# Patient Record
Sex: Female | Born: 1935 | Marital: Married
Health system: Southern US, Community
[De-identification: ages and names within clinical notes are randomized; demographics above are authoritative.]

---

## 2011-05-08 ENCOUNTER — Other Ambulatory Visit: Payer: Self-pay | Admitting: Family Medicine

## 2011-05-08 DIAGNOSIS — M25552 Pain in left hip: Secondary | ICD-10-CM

## 2011-05-12 ENCOUNTER — Ambulatory Visit
Admission: RE | Admit: 2011-05-12 | Discharge: 2011-05-12 | Disposition: A | Discharge status: Home / Self Care | Payer: Medicare Other | Source: Ambulatory Visit | Attending: Family Medicine | Admitting: Family Medicine

## 2011-05-12 DIAGNOSIS — M25552 Pain in left hip: Secondary | ICD-10-CM

## 2011-08-27 ENCOUNTER — Other Ambulatory Visit: Payer: Self-pay | Admitting: Family Medicine

## 2011-09-01 ENCOUNTER — Other Ambulatory Visit: Payer: Medicare Other

## 2016-01-07 DEATH — deceased

## 2018-02-13 ENCOUNTER — Ambulatory Visit (INDEPENDENT_AMBULATORY_CARE_PROVIDER_SITE_OTHER): Payer: Medicare Other | Admitting: Orthopaedic Surgery

## 2018-02-13 VITALS — BP 164/74 | HR 65 | Ht 60.0 in | Wt 156.0 lb

## 2018-02-13 DIAGNOSIS — M25561 Pain in right knee: Secondary | ICD-10-CM

## 2018-03-07 ENCOUNTER — Encounter (INDEPENDENT_AMBULATORY_CARE_PROVIDER_SITE_OTHER): Payer: Self-pay | Admitting: Orthopaedic Surgery

## 2018-03-07 MED ORDER — BUPIVACAINE HCL 0.25 % IJ SOLN: 4.0000 mL | INTRAMUSCULAR | Status: DC | PRN

## 2018-03-07 MED ORDER — LIDOCAINE HCL 1 % IJ SOLN: 0.5000 mL | INTRAMUSCULAR | Status: DC | PRN

## 2018-03-07 MED ORDER — METHYLPREDNISOLONE ACETATE 40 MG/ML IJ SUSP: 40.0000 mg | INTRAMUSCULAR | Status: DC | PRN

## 2018-03-07 NOTE — Progress Notes (Deleted)
Office Visit Note   Patient: Alexandra Buck           Date of Birth: Apr 23, 1927           MRN: [id number] Visit Date: 02/13/2018              Requested by: No referring provider defined for this encounter. PCP: No primary care provider on file.   Assessment & Plan: Visit Diagnoses:  1. Right knee pain, unspecified chronicity     Plan: Knee injection performed with some relief.  She will return if she has persistent problems.  Follow-Up Instructions: Return if symptoms worsen or fail to improve.   Orders:  Orders Placed This Encounter  Procedures  . Large Joint Inj: R knee   No orders of the defined types were placed in this encounter.     Procedures: Large Joint Inj: R knee on 03/07/2018 5:16 PM Indications: pain and joint swelling Details: 82 G 1.5 in needle, anterolateral approach  Arthrogram: No  Medications: 40 mg methylPREDNISolone acetate 40 MG/ML; 0.5 mL lidocaine 1 %; 4 mL bupivacaine 0.25 % Outcome: tolerated well, no immediate complications Procedure, treatment alternatives, risks and benefits explained, specific risks discussed. Consent was given by the patient. Immediately prior to procedure a time out was called to verify the correct patient, procedure, equipment, support staff and site/side marked as required. Patient was prepped and draped in the usual sterile fashion.       Clinical Data: No additional findings.   Subjective: Chief Complaint  Patient presents with  . Right Knee - Pain    HPI 84 year old female requests a right knee injection.  She is at increased pain difficulty walking due to knee osteoarthritis.  History of type 2 diabetes stage III chronic kidney disease but does not use insulin. Past history of hip avascular necrosis without hip surgery.  Has had pain with ambulation aching swelling in her knee.  Review of Systems positive for history of dehydration, type 2 diabetes not on insulin, pain swelling synovitis, hypertension, oral  abscess otherwise negative as pertains to   Objective: Vital Signs: BP (!) 164/74   Pulse 65   Ht 5' (1.524 m)   Wt 156 lb (70.8 kg)   BMI 30.47 kg/m   Physical Exam  Constitutional: She is oriented to person, place, and time. She appears well-developed.  HENT:  Head: Normocephalic.  Right Ear: External ear normal.  Left Ear: External ear normal.  Eyes: Pupils are equal, round, and reactive to light.  Neck: No tracheal deviation present. No thyromegaly present.  Cardiovascular: Normal rate.  Pulmonary/Chest: Effort normal.  Abdominal: Soft.  Neurological: She is alert and oriented to person, place, and time.  Skin: Skin is warm and dry.  Psychiatric: She has a normal mood and affect. Her behavior is normal.    Ortho Exam crepitus with right knee range of motion.  Patient's been amatory used a cane.  Distal pulses palpable.  Specialty Comments:  No specialty comments available.  Imaging: No results found.   PMFS History: There are no active problems to display for this patient.  History reviewed. No pertinent past medical history.  History reviewed. No pertinent family history.  History reviewed. No pertinent surgical history. Social History   Occupational History  . Not on file  Tobacco Use  . Smoking status: Not on file  Substance and Sexual Activity  . Alcohol use: Not on file  . Drug use: Not on file  . Sexual activity: Not  on file

## 2018-08-02 ENCOUNTER — Other Ambulatory Visit (HOSPITAL_COMMUNITY): Payer: Self-pay

## 2018-08-02 ENCOUNTER — Inpatient Hospital Stay
Admission: AD | Admit: 2018-08-02 | Discharge: 2018-08-21 | Disposition: A | Discharge status: Skilled Nursing Facility | Payer: Self-pay | Source: Other Acute Inpatient Hospital | Attending: Internal Medicine | Admitting: Internal Medicine

## 2018-08-02 DIAGNOSIS — Z452 Encounter for adjustment and management of vascular access device: Secondary | ICD-10-CM

## 2018-08-02 DIAGNOSIS — N19 Unspecified kidney failure: Secondary | ICD-10-CM

## 2018-08-02 DIAGNOSIS — D72829 Elevated white blood cell count, unspecified: Secondary | ICD-10-CM

## 2018-08-02 LAB — VANCOMYCIN, TROUGH: Vancomycin Tr: 42 ug/mL (ref 15–20)

## 2018-08-03 ENCOUNTER — Other Ambulatory Visit (HOSPITAL_COMMUNITY): Payer: Self-pay

## 2018-08-03 LAB — COMPREHENSIVE METABOLIC PANEL WITH GFR
ALT: 22 U/L (ref 0–44)
AST: 35 U/L (ref 15–41)
Albumin: 1.4 g/dL — ABNORMAL LOW (ref 3.5–5.0)
Alkaline Phosphatase: 277 U/L — ABNORMAL HIGH (ref 38–126)
Anion gap: 6 (ref 5–15)
BUN: 32 mg/dL — ABNORMAL HIGH (ref 8–23)
CO2: 12 mmol/L — ABNORMAL LOW (ref 22–32)
Calcium: 8.2 mg/dL — ABNORMAL LOW (ref 8.9–10.3)
Chloride: 121 mmol/L — ABNORMAL HIGH (ref 98–111)
Creatinine, Ser: 1.45 mg/dL — ABNORMAL HIGH (ref 0.44–1.00)
GFR calc Af Amer: 36 mL/min — ABNORMAL LOW
GFR calc non Af Amer: 31 mL/min — ABNORMAL LOW
Glucose, Bld: 75 mg/dL (ref 70–99)
Potassium: 4.8 mmol/L (ref 3.5–5.1)
Sodium: 139 mmol/L (ref 135–145)
Total Bilirubin: 0.6 mg/dL (ref 0.3–1.2)
Total Protein: 5.1 g/dL — ABNORMAL LOW (ref 6.5–8.1)

## 2018-08-03 LAB — CBC WITH DIFFERENTIAL/PLATELET
Basophils Absolute: 0.2 10*3/uL — ABNORMAL HIGH (ref 0.0–0.1)
Basophils Relative: 1 %
EOS ABS: 0 10*3/uL (ref 0.0–0.5)
Eosinophils Relative: 0 %
HCT: 27.8 % — ABNORMAL LOW (ref 36.0–46.0)
HEMOGLOBIN: 8.5 g/dL — AB (ref 12.0–15.0)
LYMPHS ABS: 0.8 10*3/uL (ref 0.7–4.0)
Lymphocytes Relative: 4 %
MCH: 28.2 pg (ref 26.0–34.0)
MCHC: 30.6 g/dL (ref 30.0–36.0)
MCV: 92.4 fL (ref 80.0–100.0)
MONOS PCT: 2 %
MYELOCYTES: 2 %
Metamyelocytes Relative: 3 %
Monocytes Absolute: 0.4 10*3/uL (ref 0.1–1.0)
NEUTROS PCT: 88 %
Neutro Abs: 19.7 10*3/uL — ABNORMAL HIGH (ref 1.7–7.7)
Platelets: 380 10*3/uL (ref 150–400)
RBC: 3.01 MIL/uL — ABNORMAL LOW (ref 3.87–5.11)
RDW: 17.9 % — ABNORMAL HIGH (ref 11.5–15.5)
WBC: 21.2 10*3/uL — ABNORMAL HIGH (ref 4.0–10.5)
nRBC: 0 % (ref 0.0–0.2)
nRBC: 0 /100 WBC

## 2018-08-03 LAB — TSH: TSH: 1.94 u[IU]/mL (ref 0.350–4.500)

## 2018-08-03 LAB — PROTIME-INR
INR: 1.27
Prothrombin Time: 15.8 s — ABNORMAL HIGH (ref 11.4–15.2)

## 2018-08-03 LAB — URINALYSIS, ROUTINE W REFLEX MICROSCOPIC
BILIRUBIN URINE: NEGATIVE
Glucose, UA: NEGATIVE mg/dL
KETONES UR: 5 mg/dL — AB
Nitrite: NEGATIVE
Protein, ur: NEGATIVE mg/dL
Specific Gravity, Urine: 1.019 (ref 1.005–1.030)
pH: 5 (ref 5.0–8.0)

## 2018-08-03 LAB — T4, FREE: FREE T4: 0.91 ng/dL (ref 0.82–1.77)

## 2018-08-03 LAB — VANCOMYCIN, TROUGH: VANCOMYCIN TR: 40 ug/mL — AB (ref 15–20)

## 2018-08-03 LAB — PHOSPHORUS: Phosphorus: 3.6 mg/dL (ref 2.5–4.6)

## 2018-08-03 LAB — HEMOGLOBIN A1C
HEMOGLOBIN A1C: 5.6 % (ref 4.8–5.6)
Mean Plasma Glucose: 114.02 mg/dL

## 2018-08-03 LAB — MAGNESIUM: MAGNESIUM: 2 mg/dL (ref 1.7–2.4)

## 2018-08-04 LAB — BASIC METABOLIC PANEL
ANION GAP: 4 — AB (ref 5–15)
BUN: 33 mg/dL — ABNORMAL HIGH (ref 8–23)
CALCIUM: 8.4 mg/dL — AB (ref 8.9–10.3)
CO2: 16 mmol/L — ABNORMAL LOW (ref 22–32)
Chloride: 119 mmol/L — ABNORMAL HIGH (ref 98–111)
Creatinine, Ser: 1.5 mg/dL — ABNORMAL HIGH (ref 0.44–1.00)
GFR, EST AFRICAN AMERICAN: 34 mL/min — AB (ref >60)
GFR, EST NON AFRICAN AMERICAN: 30 mL/min — AB (ref >60)
GLUCOSE: 145 mg/dL — AB (ref 70–99)
Potassium: 4.7 mmol/L (ref 3.5–5.1)
SODIUM: 139 mmol/L (ref 135–145)

## 2018-08-04 LAB — URINE CULTURE: CULTURE: NO GROWTH

## 2018-08-04 LAB — VANCOMYCIN, TROUGH: VANCOMYCIN TR: 29 ug/mL — AB (ref 15–20)

## 2018-08-04 LAB — LACTIC ACID, PLASMA: LACTIC ACID, VENOUS: 2 mmol/L — AB (ref 0.5–1.9)

## 2018-08-04 MED ORDER — INSULIN LISPRO 100 UNIT/ML ~~LOC~~ SOLN
2.00 | SUBCUTANEOUS | Status: DC
Start: 2018-08-02 — End: 2018-08-04

## 2018-08-04 MED ORDER — HYDROCODONE-ACETAMINOPHEN 5-325 MG PO TABS
1.00 | ORAL_TABLET | ORAL | Status: DC
Start: ? — End: 2018-08-04

## 2018-08-04 MED ORDER — HEPARIN SODIUM (PORCINE) 5000 UNIT/ML IJ SOLN
5000.00 | INTRAMUSCULAR | Status: DC
Start: 2018-08-02 — End: 2018-08-04

## 2018-08-04 MED ORDER — KCL IN DEXTROSE-NACL 20-5-0.45 MEQ/L-%-% IV SOLN
INTRAVENOUS | Status: DC
Start: ? — End: 2018-08-04

## 2018-08-04 MED ORDER — ACETAMINOPHEN 500 MG PO TABS
500.00 | ORAL_TABLET | ORAL | Status: DC
Start: 2018-08-02 — End: 2018-08-04

## 2018-08-04 MED ORDER — DULOXETINE HCL 20 MG PO CPEP
20.00 | ORAL_CAPSULE | ORAL | Status: DC
Start: 2018-08-02 — End: 2018-08-04

## 2018-08-04 MED ORDER — FERROUS GLUCONATE 324 (38 FE) MG PO TABS
324.00 | ORAL_TABLET | ORAL | Status: DC
Start: 2018-08-03 — End: 2018-08-04

## 2018-08-04 MED ORDER — LEVOTHYROXINE SODIUM 100 MCG PO TABS
100.00 | ORAL_TABLET | ORAL | Status: DC
Start: 2018-08-03 — End: 2018-08-04

## 2018-08-04 MED ORDER — PANTOPRAZOLE SODIUM 40 MG PO TBEC
40.00 | DELAYED_RELEASE_TABLET | ORAL | Status: DC
Start: 2018-08-03 — End: 2018-08-04

## 2018-08-04 MED ORDER — VANCOMYCIN HCL IN NACL 750-0.9 MG/150ML-% IV SOLN
750.00 | INTRAVENOUS | Status: DC
Start: ? — End: 2018-08-04

## 2018-08-04 MED ORDER — DEXTROSE 10 % IV SOLN
125.00 | INTRAVENOUS | Status: DC
Start: ? — End: 2018-08-04

## 2018-08-04 MED ORDER — GENERIC EXTERNAL MEDICATION
Status: DC
Start: 2018-08-02 — End: 2018-08-04

## 2018-08-04 MED ORDER — NYSTATIN 100000 UNIT/GM EX POWD
CUTANEOUS | Status: DC
Start: 2018-08-02 — End: 2018-08-04

## 2018-08-04 MED ORDER — GENERIC EXTERNAL MEDICATION
1.00 | Status: DC
Start: 2018-08-02 — End: 2018-08-04

## 2018-08-04 MED ORDER — POLYETHYLENE GLYCOL 3350 17 G PO PACK
17.00 | PACK | ORAL | Status: DC
Start: 2018-08-03 — End: 2018-08-04

## 2018-08-04 MED ORDER — MUPIROCIN 2 % EX OINT
TOPICAL_OINTMENT | CUTANEOUS | Status: DC
Start: 2018-08-02 — End: 2018-08-04

## 2018-08-05 ENCOUNTER — Other Ambulatory Visit (HOSPITAL_COMMUNITY): Payer: Self-pay

## 2018-08-05 LAB — CBC
HCT: 25.9 % — ABNORMAL LOW (ref 36.0–46.0)
HEMOGLOBIN: 8.1 g/dL — AB (ref 12.0–15.0)
MCH: 28.4 pg (ref 26.0–34.0)
MCHC: 31.3 g/dL (ref 30.0–36.0)
MCV: 90.9 fL (ref 80.0–100.0)
PLATELETS: 292 10*3/uL (ref 150–400)
RBC: 2.85 MIL/uL — ABNORMAL LOW (ref 3.87–5.11)
RDW: 17.8 % — ABNORMAL HIGH (ref 11.5–15.5)
WBC: 16.2 10*3/uL — AB (ref 4.0–10.5)
nRBC: 0 % (ref 0.0–0.2)

## 2018-08-05 LAB — RENAL FUNCTION PANEL
ALBUMIN: 1.2 g/dL — AB (ref 3.5–5.0)
Anion gap: 5 (ref 5–15)
BUN: 32 mg/dL — AB (ref 8–23)
CALCIUM: 8.1 mg/dL — AB (ref 8.9–10.3)
CO2: 14 mmol/L — AB (ref 22–32)
Chloride: 118 mmol/L — ABNORMAL HIGH (ref 98–111)
Creatinine, Ser: 1.31 mg/dL — ABNORMAL HIGH (ref 0.44–1.00)
GFR calc Af Amer: 41 mL/min — ABNORMAL LOW (ref >60)
GFR calc non Af Amer: 35 mL/min — ABNORMAL LOW (ref >60)
GLUCOSE: 87 mg/dL (ref 70–99)
PHOSPHORUS: 3.3 mg/dL (ref 2.5–4.6)
Potassium: 4.3 mmol/L (ref 3.5–5.1)
SODIUM: 137 mmol/L (ref 135–145)

## 2018-08-05 LAB — MAGNESIUM: Magnesium: 1.8 mg/dL (ref 1.7–2.4)

## 2018-08-05 LAB — C DIFFICILE QUICK SCREEN W PCR REFLEX
C DIFFICILE (CDIFF) INTERP: NOT DETECTED
C DIFFICILE (CDIFF) TOXIN: NEGATIVE
C Diff antigen: NEGATIVE

## 2018-08-06 LAB — RENAL FUNCTION PANEL
ALBUMIN: 1.1 g/dL — AB (ref 3.5–5.0)
ANION GAP: 6 (ref 5–15)
BUN: 31 mg/dL — ABNORMAL HIGH (ref 8–23)
CALCIUM: 8.1 mg/dL — AB (ref 8.9–10.3)
CO2: 17 mmol/L — AB (ref 22–32)
CREATININE: 1.4 mg/dL — AB (ref 0.44–1.00)
Chloride: 112 mmol/L — ABNORMAL HIGH (ref 98–111)
GFR calc non Af Amer: 32 mL/min — ABNORMAL LOW (ref >60)
GFR, EST AFRICAN AMERICAN: 37 mL/min — AB (ref >60)
Glucose, Bld: 105 mg/dL — ABNORMAL HIGH (ref 70–99)
PHOSPHORUS: 3.2 mg/dL (ref 2.5–4.6)
Potassium: 3.5 mmol/L (ref 3.5–5.1)
SODIUM: 135 mmol/L (ref 135–145)

## 2018-08-06 LAB — LACTIC ACID, PLASMA: LACTIC ACID, VENOUS: 1.3 mmol/L (ref 0.5–1.9)

## 2018-08-06 LAB — MAGNESIUM: MAGNESIUM: 1.7 mg/dL (ref 1.7–2.4)

## 2018-08-07 LAB — MAGNESIUM: MAGNESIUM: 2.1 mg/dL (ref 1.7–2.4)

## 2018-08-07 LAB — VANCOMYCIN, TROUGH: VANCOMYCIN TR: 23 ug/mL — AB (ref 15–20)

## 2018-08-08 LAB — PREPARE RBC (CROSSMATCH)

## 2018-08-08 LAB — CBC
HCT: 20.8 % — ABNORMAL LOW (ref 36.0–46.0)
Hemoglobin: 6.6 g/dL — CL (ref 12.0–15.0)
MCH: 29.3 pg (ref 26.0–34.0)
MCHC: 31.7 g/dL (ref 30.0–36.0)
MCV: 92.4 fL (ref 80.0–100.0)
NRBC: 0 % (ref 0.0–0.2)
PLATELETS: 258 10*3/uL (ref 150–400)
RBC: 2.25 MIL/uL — AB (ref 3.87–5.11)
RDW: 18.1 % — ABNORMAL HIGH (ref 11.5–15.5)
WBC: 18.7 10*3/uL — ABNORMAL HIGH (ref 4.0–10.5)

## 2018-08-08 LAB — RENAL FUNCTION PANEL
Albumin: 1.3 g/dL — ABNORMAL LOW (ref 3.5–5.0)
Anion gap: 6 (ref 5–15)
BUN: 47 mg/dL — AB (ref 8–23)
CO2: 17 mmol/L — AB (ref 22–32)
Calcium: 8.4 mg/dL — ABNORMAL LOW (ref 8.9–10.3)
Chloride: 111 mmol/L (ref 98–111)
Creatinine, Ser: 1.83 mg/dL — ABNORMAL HIGH (ref 0.44–1.00)
GFR, EST AFRICAN AMERICAN: 27 mL/min — AB (ref >60)
GFR, EST NON AFRICAN AMERICAN: 23 mL/min — AB (ref >60)
Glucose, Bld: 96 mg/dL (ref 70–99)
Phosphorus: 3.4 mg/dL (ref 2.5–4.6)
Potassium: 4.5 mmol/L (ref 3.5–5.1)
SODIUM: 134 mmol/L — AB (ref 135–145)

## 2018-08-08 LAB — CULTURE, BLOOD (ROUTINE X 2)
Culture: NO GROWTH
Culture: NO GROWTH
Special Requests: ADEQUATE

## 2018-08-08 LAB — LACTIC ACID, PLASMA: Lactic Acid, Venous: 1.3 mmol/L (ref 0.5–1.9)

## 2018-08-08 LAB — MAGNESIUM: MAGNESIUM: 2 mg/dL (ref 1.7–2.4)

## 2018-08-08 LAB — ABO/RH: ABO/RH(D): A POS

## 2018-08-08 LAB — HEMOGLOBIN AND HEMATOCRIT, BLOOD
HCT: 21.1 % — ABNORMAL LOW (ref 36.0–46.0)
Hemoglobin: 6.6 g/dL — CL (ref 12.0–15.0)

## 2018-08-09 LAB — TYPE AND SCREEN
ABO/RH(D): A POS
Antibody Screen: NEGATIVE
UNIT DIVISION: 0

## 2018-08-09 LAB — URINALYSIS, ROUTINE W REFLEX MICROSCOPIC
Bilirubin Urine: NEGATIVE
Glucose, UA: NEGATIVE mg/dL
Ketones, ur: NEGATIVE mg/dL
NITRITE: NEGATIVE
PH: 5 (ref 5.0–8.0)
Protein, ur: 30 mg/dL — AB
RBC / HPF: >50 RBC/hpf — ABNORMAL HIGH (ref 0–5)
SPECIFIC GRAVITY, URINE: 1.014 (ref 1.005–1.030)
WBC, UA: >50 WBC/hpf — ABNORMAL HIGH (ref 0–5)

## 2018-08-09 LAB — CBC
HEMATOCRIT: 27.7 % — AB (ref 36.0–46.0)
Hemoglobin: 8.8 g/dL — ABNORMAL LOW (ref 12.0–15.0)
MCH: 29.2 pg (ref 26.0–34.0)
MCHC: 31.8 g/dL (ref 30.0–36.0)
MCV: 92 fL (ref 80.0–100.0)
Platelets: 293 10*3/uL (ref 150–400)
RBC: 3.01 MIL/uL — AB (ref 3.87–5.11)
RDW: 17.6 % — ABNORMAL HIGH (ref 11.5–15.5)
WBC: 20.3 10*3/uL — ABNORMAL HIGH (ref 4.0–10.5)
nRBC: 0 % (ref 0.0–0.2)

## 2018-08-09 LAB — C DIFFICILE QUICK SCREEN W PCR REFLEX
C DIFFICILE (CDIFF) INTERP: NOT DETECTED
C DIFFICILE (CDIFF) TOXIN: NEGATIVE
C Diff antigen: NEGATIVE

## 2018-08-09 LAB — BPAM RBC
Blood Product Expiration Date: 201911282359
ISSUE DATE / TIME: 201911011653
UNIT TYPE AND RH: 6200

## 2018-08-09 LAB — VANCOMYCIN, TROUGH: Vancomycin Tr: 17 ug/mL (ref 15–20)

## 2018-08-10 LAB — RENAL FUNCTION PANEL
ANION GAP: 4 — AB (ref 5–15)
Albumin: 1.5 g/dL — ABNORMAL LOW (ref 3.5–5.0)
BUN: 45 mg/dL — ABNORMAL HIGH (ref 8–23)
CHLORIDE: 115 mmol/L — AB (ref 98–111)
CO2: 17 mmol/L — AB (ref 22–32)
Calcium: 8.6 mg/dL — ABNORMAL LOW (ref 8.9–10.3)
Creatinine, Ser: 1.8 mg/dL — ABNORMAL HIGH (ref 0.44–1.00)
GFR, EST AFRICAN AMERICAN: 28 mL/min — AB (ref >60)
GFR, EST NON AFRICAN AMERICAN: 24 mL/min — AB (ref >60)
Glucose, Bld: 128 mg/dL — ABNORMAL HIGH (ref 70–99)
POTASSIUM: 4.2 mmol/L (ref 3.5–5.1)
Phosphorus: 3.2 mg/dL (ref 2.5–4.6)
Sodium: 136 mmol/L (ref 135–145)

## 2018-08-10 LAB — CBC
HEMATOCRIT: 26.4 % — AB (ref 36.0–46.0)
Hemoglobin: 8 g/dL — ABNORMAL LOW (ref 12.0–15.0)
MCH: 28.7 pg (ref 26.0–34.0)
MCHC: 30.3 g/dL (ref 30.0–36.0)
MCV: 94.6 fL (ref 80.0–100.0)
NRBC: 0 % (ref 0.0–0.2)
Platelets: 251 10*3/uL (ref 150–400)
RBC: 2.79 MIL/uL — AB (ref 3.87–5.11)
RDW: 17.9 % — ABNORMAL HIGH (ref 11.5–15.5)
WBC: 16 10*3/uL — ABNORMAL HIGH (ref 4.0–10.5)

## 2018-08-10 LAB — URINE CULTURE

## 2018-08-10 LAB — LACTIC ACID, PLASMA: LACTIC ACID, VENOUS: 1.3 mmol/L (ref 0.5–1.9)

## 2018-08-10 LAB — MAGNESIUM: MAGNESIUM: 1.9 mg/dL (ref 1.7–2.4)

## 2018-08-12 ENCOUNTER — Other Ambulatory Visit (HOSPITAL_COMMUNITY): Payer: Self-pay

## 2018-08-12 LAB — BASIC METABOLIC PANEL
Anion gap: 3 — ABNORMAL LOW (ref 5–15)
BUN: 41 mg/dL — AB (ref 8–23)
CHLORIDE: 118 mmol/L — AB (ref 98–111)
CO2: 17 mmol/L — ABNORMAL LOW (ref 22–32)
Calcium: 8.7 mg/dL — ABNORMAL LOW (ref 8.9–10.3)
Creatinine, Ser: 1.7 mg/dL — ABNORMAL HIGH (ref 0.44–1.00)
GFR calc Af Amer: 30 mL/min — ABNORMAL LOW (ref >60)
GFR calc non Af Amer: 26 mL/min — ABNORMAL LOW (ref >60)
Glucose, Bld: 117 mg/dL — ABNORMAL HIGH (ref 70–99)
POTASSIUM: 4.1 mmol/L (ref 3.5–5.1)
SODIUM: 138 mmol/L (ref 135–145)

## 2018-08-12 LAB — CBC
HEMATOCRIT: 27.2 % — AB (ref 36.0–46.0)
HEMOGLOBIN: 8 g/dL — AB (ref 12.0–15.0)
MCH: 28.3 pg (ref 26.0–34.0)
MCHC: 29.4 g/dL — ABNORMAL LOW (ref 30.0–36.0)
MCV: 96.1 fL (ref 80.0–100.0)
Platelets: 262 10*3/uL (ref 150–400)
RBC: 2.83 MIL/uL — ABNORMAL LOW (ref 3.87–5.11)
RDW: 18.6 % — ABNORMAL HIGH (ref 11.5–15.5)
WBC: 11.1 10*3/uL — ABNORMAL HIGH (ref 4.0–10.5)
nRBC: 0 % (ref 0.0–0.2)

## 2018-08-12 LAB — MAGNESIUM: MAGNESIUM: 1.6 mg/dL — AB (ref 1.7–2.4)

## 2018-08-13 LAB — MAGNESIUM: Magnesium: 2 mg/dL (ref 1.7–2.4)

## 2018-08-14 LAB — CBC
HEMATOCRIT: 26.4 % — AB (ref 36.0–46.0)
HEMOGLOBIN: 7.9 g/dL — AB (ref 12.0–15.0)
MCH: 28.8 pg (ref 26.0–34.0)
MCHC: 29.9 g/dL — AB (ref 30.0–36.0)
MCV: 96.4 fL (ref 80.0–100.0)
NRBC: 0 % (ref 0.0–0.2)
Platelets: 265 10*3/uL (ref 150–400)
RBC: 2.74 MIL/uL — AB (ref 3.87–5.11)
RDW: 18.6 % — ABNORMAL HIGH (ref 11.5–15.5)
WBC: 10.5 10*3/uL (ref 4.0–10.5)

## 2018-08-14 LAB — BASIC METABOLIC PANEL
ANION GAP: 3 — AB (ref 5–15)
BUN: 39 mg/dL — ABNORMAL HIGH (ref 8–23)
CHLORIDE: 119 mmol/L — AB (ref 98–111)
CO2: 18 mmol/L — ABNORMAL LOW (ref 22–32)
Calcium: 9 mg/dL (ref 8.9–10.3)
Creatinine, Ser: 1.85 mg/dL — ABNORMAL HIGH (ref 0.44–1.00)
GFR calc non Af Amer: 23 mL/min — ABNORMAL LOW (ref >60)
GFR, EST AFRICAN AMERICAN: 27 mL/min — AB (ref >60)
Glucose, Bld: 102 mg/dL — ABNORMAL HIGH (ref 70–99)
POTASSIUM: 4.2 mmol/L (ref 3.5–5.1)
Sodium: 140 mmol/L (ref 135–145)

## 2018-08-14 LAB — MAGNESIUM: Magnesium: 1.9 mg/dL (ref 1.7–2.4)

## 2018-08-16 LAB — BASIC METABOLIC PANEL
ANION GAP: 3 — AB (ref 5–15)
BUN: 37 mg/dL — ABNORMAL HIGH (ref 8–23)
CALCIUM: 8.8 mg/dL — AB (ref 8.9–10.3)
CO2: 19 mmol/L — ABNORMAL LOW (ref 22–32)
Chloride: 117 mmol/L — ABNORMAL HIGH (ref 98–111)
Creatinine, Ser: 1.72 mg/dL — ABNORMAL HIGH (ref 0.44–1.00)
GFR, EST AFRICAN AMERICAN: 29 mL/min — AB (ref >60)
GFR, EST NON AFRICAN AMERICAN: 25 mL/min — AB (ref >60)
GLUCOSE: 120 mg/dL — AB (ref 70–99)
Potassium: 4.1 mmol/L (ref 3.5–5.1)
SODIUM: 139 mmol/L (ref 135–145)

## 2018-08-16 LAB — MAGNESIUM: Magnesium: 1.5 mg/dL — ABNORMAL LOW (ref 1.7–2.4)

## 2018-08-17 LAB — MAGNESIUM: Magnesium: 1.9 mg/dL (ref 1.7–2.4)

## 2018-08-18 LAB — CBC
HEMATOCRIT: 27.8 % — AB (ref 36.0–46.0)
Hemoglobin: 8.1 g/dL — ABNORMAL LOW (ref 12.0–15.0)
MCH: 28.7 pg (ref 26.0–34.0)
MCHC: 29.1 g/dL — AB (ref 30.0–36.0)
MCV: 98.6 fL (ref 80.0–100.0)
Platelets: 290 10*3/uL (ref 150–400)
RBC: 2.82 MIL/uL — ABNORMAL LOW (ref 3.87–5.11)
RDW: 18.6 % — ABNORMAL HIGH (ref 11.5–15.5)
WBC: 8.6 10*3/uL (ref 4.0–10.5)
nRBC: 0 % (ref 0.0–0.2)

## 2018-08-18 LAB — BASIC METABOLIC PANEL
Anion gap: 6 (ref 5–15)
BUN: 38 mg/dL — AB (ref 8–23)
CO2: 18 mmol/L — AB (ref 22–32)
CREATININE: 1.53 mg/dL — AB (ref 0.44–1.00)
Calcium: 8.8 mg/dL — ABNORMAL LOW (ref 8.9–10.3)
Chloride: 112 mmol/L — ABNORMAL HIGH (ref 98–111)
GFR calc non Af Amer: 29 mL/min — ABNORMAL LOW (ref >60)
GFR, EST AFRICAN AMERICAN: 34 mL/min — AB (ref >60)
GLUCOSE: 148 mg/dL — AB (ref 70–99)
Potassium: 5.3 mmol/L — ABNORMAL HIGH (ref 3.5–5.1)
Sodium: 136 mmol/L (ref 135–145)

## 2018-08-18 LAB — TSH: TSH: 2.096 u[IU]/mL (ref 0.350–4.500)

## 2018-08-18 LAB — T4, FREE: FREE T4: 0.96 ng/dL (ref 0.82–1.77)

## 2018-08-19 ENCOUNTER — Other Ambulatory Visit (HOSPITAL_COMMUNITY): Payer: Self-pay

## 2018-08-19 LAB — POTASSIUM: Potassium: 5.7 mmol/L — ABNORMAL HIGH (ref 3.5–5.1)

## 2018-08-19 LAB — CORTISOL: Cortisol, Plasma: 9.3 ug/dL

## 2018-08-20 LAB — BASIC METABOLIC PANEL
ANION GAP: 4 — AB (ref 5–15)
BUN: 42 mg/dL — ABNORMAL HIGH (ref 8–23)
CO2: 21 mmol/L — AB (ref 22–32)
Calcium: 8.8 mg/dL — ABNORMAL LOW (ref 8.9–10.3)
Chloride: 114 mmol/L — ABNORMAL HIGH (ref 98–111)
Creatinine, Ser: 1.55 mg/dL — ABNORMAL HIGH (ref 0.44–1.00)
GFR calc non Af Amer: 29 mL/min — ABNORMAL LOW (ref >60)
GFR, EST AFRICAN AMERICAN: 33 mL/min — AB (ref >60)
Glucose, Bld: 125 mg/dL — ABNORMAL HIGH (ref 70–99)
POTASSIUM: 5.2 mmol/L — AB (ref 3.5–5.1)
Sodium: 139 mmol/L (ref 135–145)

## 2018-08-20 LAB — CBC
HEMATOCRIT: 23.7 % — AB (ref 36.0–46.0)
HEMOGLOBIN: 7.1 g/dL — AB (ref 12.0–15.0)
MCH: 29 pg (ref 26.0–34.0)
MCHC: 30 g/dL (ref 30.0–36.0)
MCV: 96.7 fL (ref 80.0–100.0)
Platelets: 271 10*3/uL (ref 150–400)
RBC: 2.45 MIL/uL — AB (ref 3.87–5.11)
RDW: 18.2 % — ABNORMAL HIGH (ref 11.5–15.5)
WBC: 9 10*3/uL (ref 4.0–10.5)
nRBC: 0 % (ref 0.0–0.2)

## 2018-08-20 LAB — HEMOGLOBIN AND HEMATOCRIT, BLOOD
HCT: 27.1 % — ABNORMAL LOW (ref 36.0–46.0)
HEMOGLOBIN: 8.4 g/dL — AB (ref 12.0–15.0)

## 2018-08-20 LAB — MAGNESIUM: Magnesium: 1.5 mg/dL — ABNORMAL LOW (ref 1.7–2.4)

## 2018-08-20 LAB — PREPARE RBC (CROSSMATCH)

## 2018-08-21 LAB — TYPE AND SCREEN
ABO/RH(D): A POS
ANTIBODY SCREEN: NEGATIVE
Unit division: 0

## 2018-08-21 LAB — CBC
HCT: 26.6 % — ABNORMAL LOW (ref 36.0–46.0)
HEMOGLOBIN: 8.1 g/dL — AB (ref 12.0–15.0)
MCH: 29 pg (ref 26.0–34.0)
MCHC: 30.5 g/dL (ref 30.0–36.0)
MCV: 95.3 fL (ref 80.0–100.0)
PLATELETS: 290 10*3/uL (ref 150–400)
RBC: 2.79 MIL/uL — ABNORMAL LOW (ref 3.87–5.11)
RDW: 17.4 % — AB (ref 11.5–15.5)
WBC: 8.9 10*3/uL (ref 4.0–10.5)
nRBC: 0 % (ref 0.0–0.2)

## 2018-08-21 LAB — BPAM RBC
BLOOD PRODUCT EXPIRATION DATE: 201912062359
ISSUE DATE / TIME: 201911131653
Unit Type and Rh: 6200

## 2018-08-21 LAB — POTASSIUM: POTASSIUM: 4.8 mmol/L (ref 3.5–5.1)

## 2018-10-08 DEATH — deceased

## 2020-05-03 IMAGING — US US RENAL
1 series · 14 of 25 positions shown · non-contrast
Comparison: None.

CLINICAL DATA: Initial evaluation for acute renal failure.

EXAM:
RENAL / URINARY TRACT ULTRASOUND COMPLETE

[Series 1: us renal · 0.24mm/px · 14 of 33 slices shown]
[im 1/33]
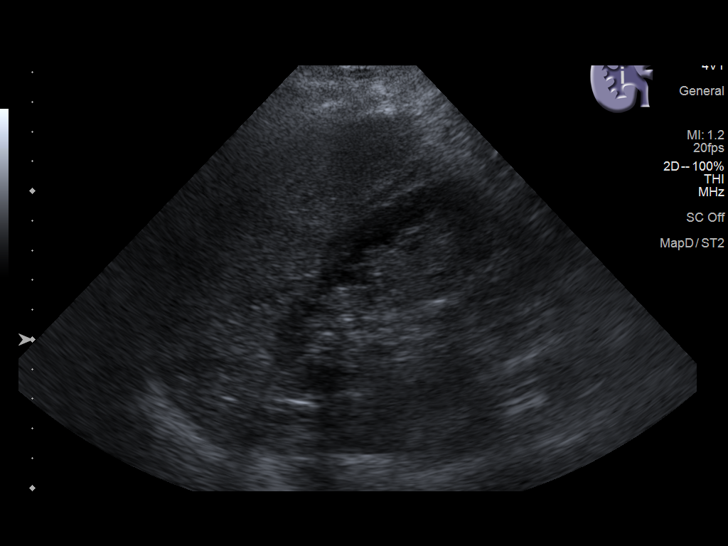
[im 3/33]
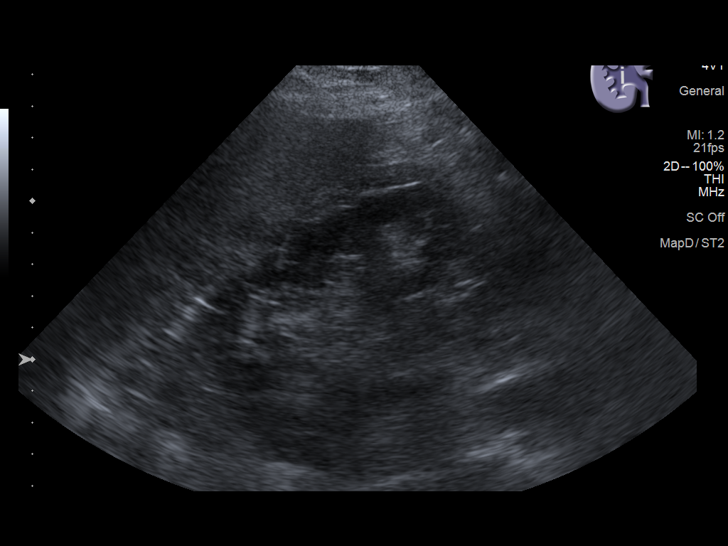
[im 6/33]
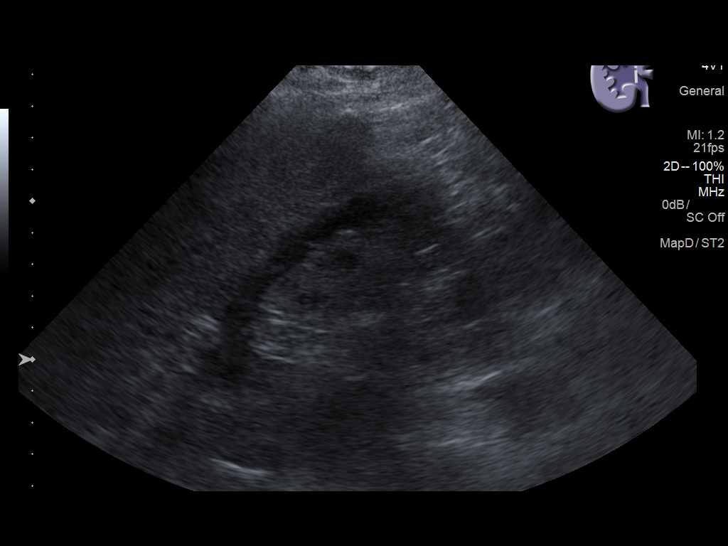
[im 9/33]
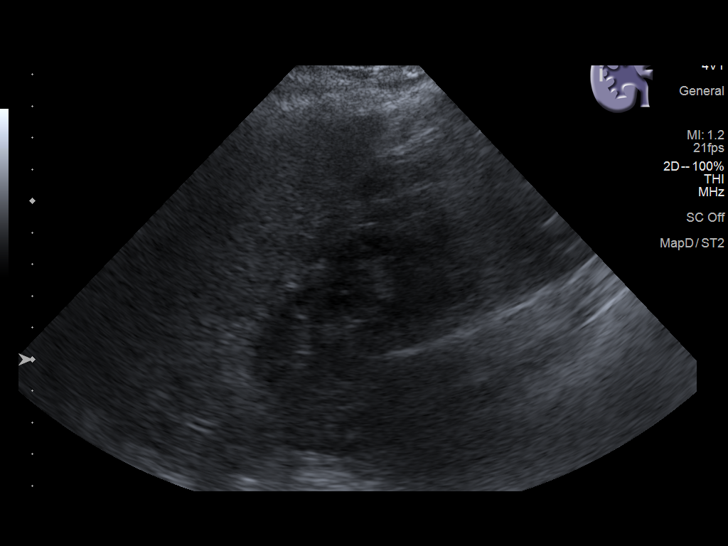
[im 11/33]
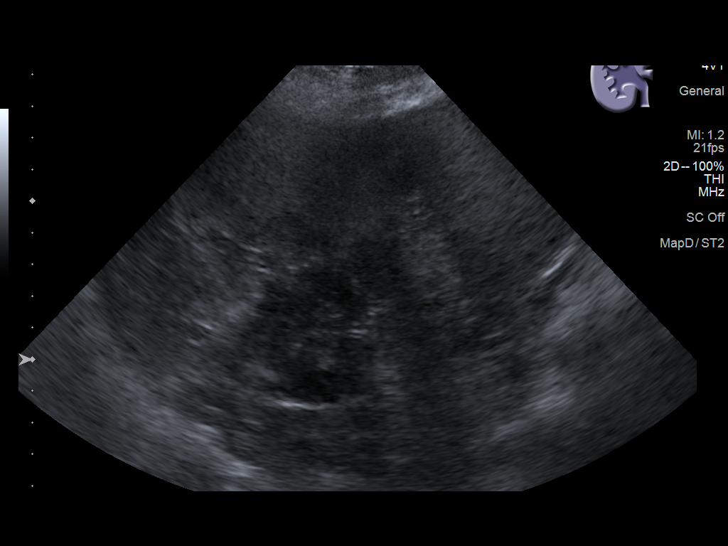
[im 13/33]
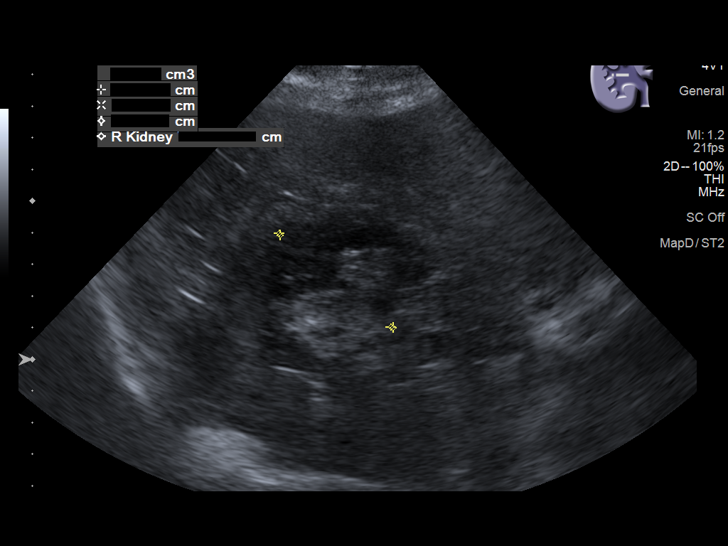
[im 15/33]
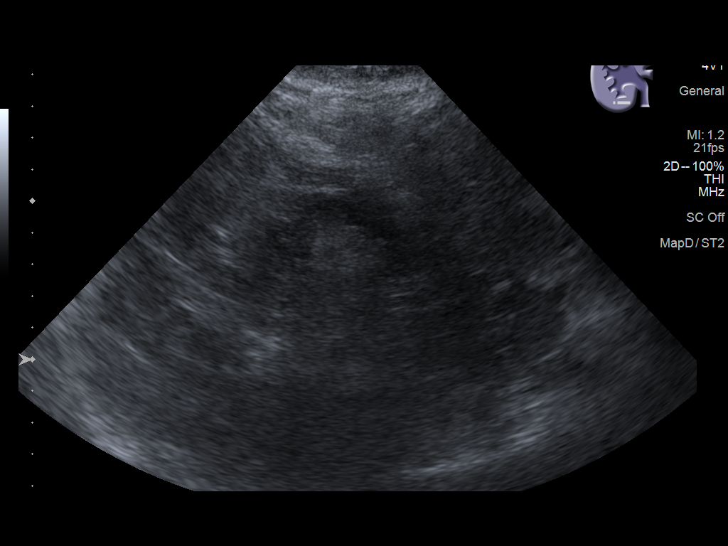
[im 18/33]
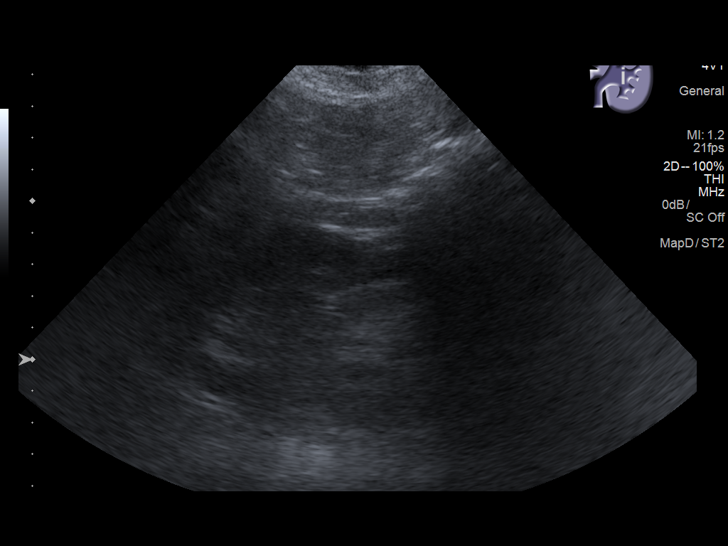
[im 21/33]
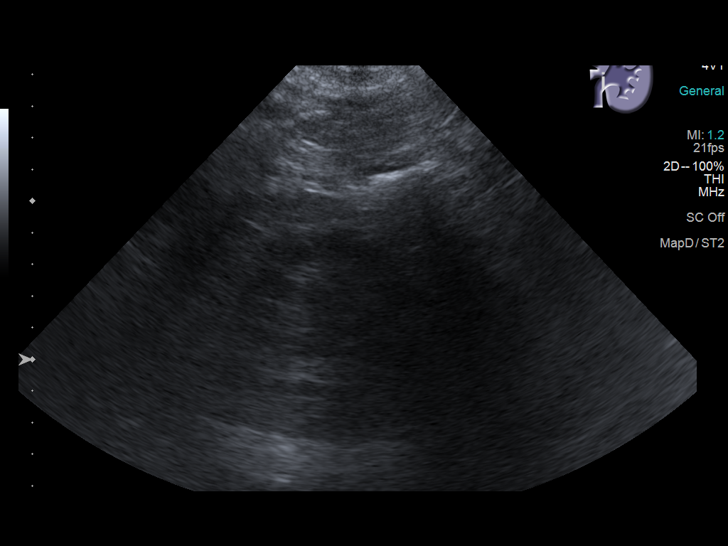
[im 22/33]
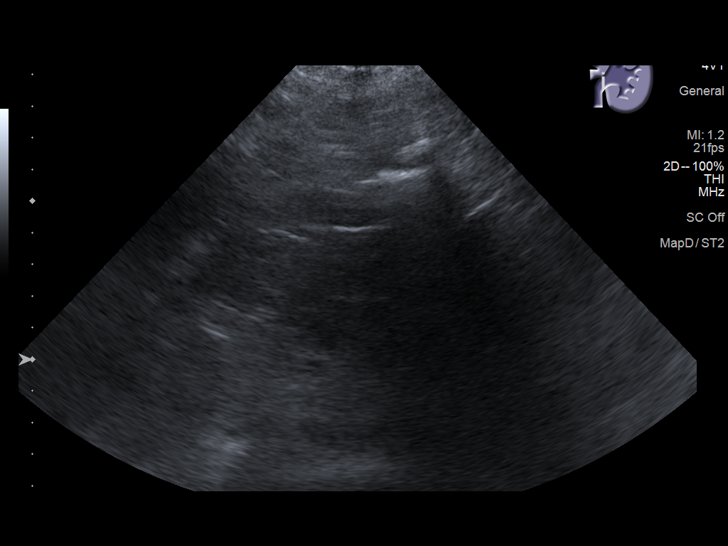
[im 25/33]
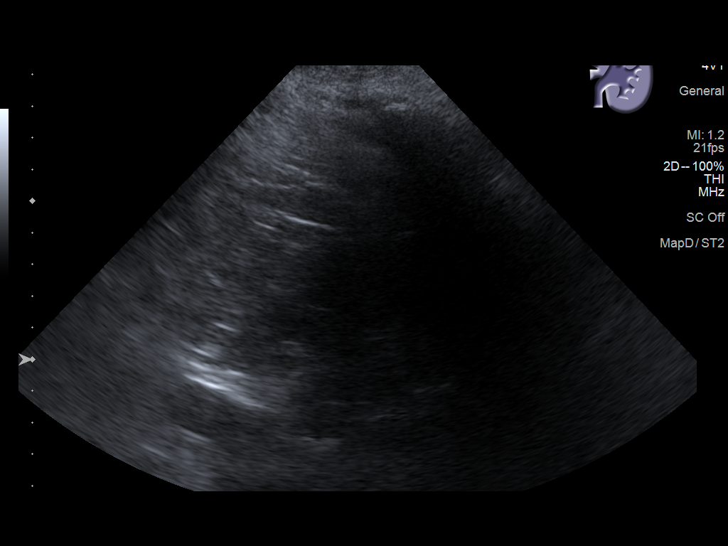
[im 27/33]
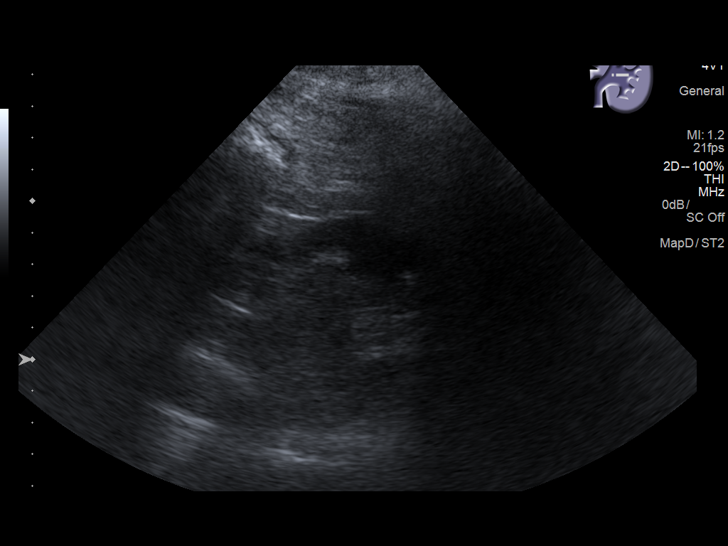
[im 30/33]
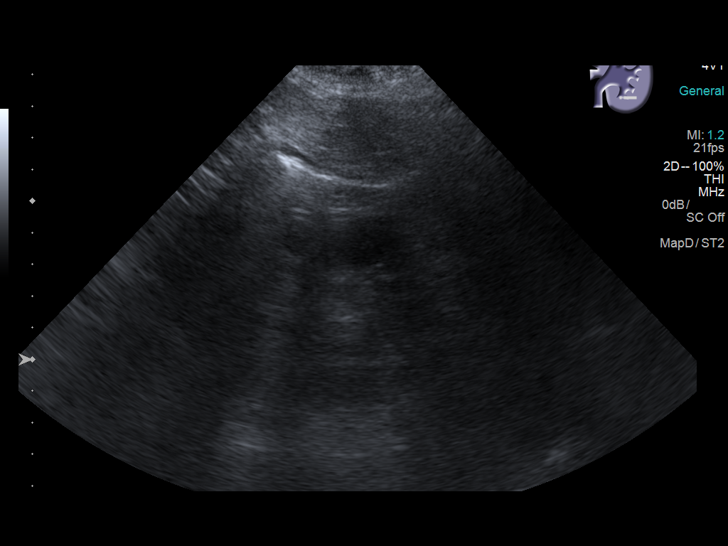
[im 33/33]
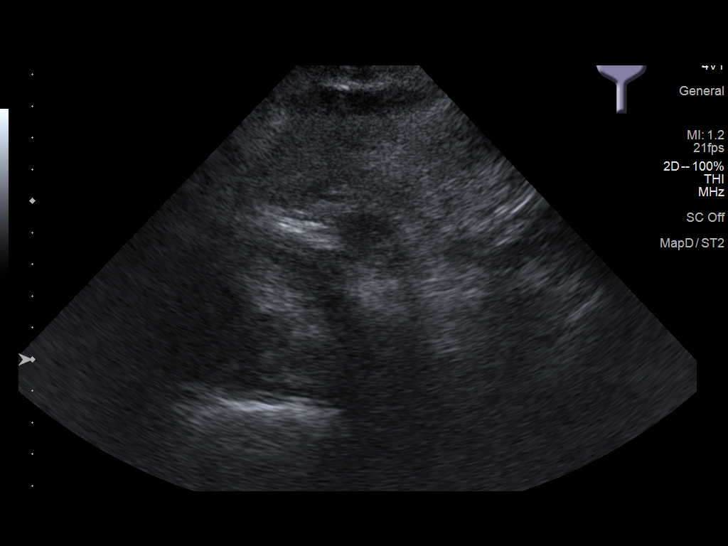

[14 of 25 positions shown; findings below may reference images not displayed]

FINDINGS: Right Kidney:

Renal measurements: 9.5 x 3.8 x 4.6 cm = volume: 87.3 mL .
Echogenicity within normal limits. Mild diffuse chronic cortical
thinning. No mass or hydronephrosis visualized.

Left Kidney:

Renal measurements: 9.1 x 5.0 x 5.0 cm = volume: 116.9 mL.
Echogenicity within normal limits. Mild diffuse chronic cortical
thinning. No mass or hydronephrosis visualized.

Bladder:

Bladder is decompressed and poorly visualized.
IMPRESSION: 1. Mild diffuse chronic cortical thinning.
2. Otherwise negative renal ultrasound.  No hydronephrosis.
